# Patient Record
Sex: Female | Born: 1980 | Race: White | Hispanic: No | Marital: Single | Smoking: Current every day smoker
Health system: Southern US, Community
[De-identification: ages and names within clinical notes are randomized; demographics above are authoritative.]

## PROBLEM LIST (undated history)

## (undated) DIAGNOSIS — N390 Urinary tract infection, site not specified: Secondary | ICD-10-CM

## (undated) DIAGNOSIS — F419 Anxiety disorder, unspecified: Secondary | ICD-10-CM

## (undated) DIAGNOSIS — R251 Tremor, unspecified: Secondary | ICD-10-CM

## (undated) DIAGNOSIS — J45909 Unspecified asthma, uncomplicated: Secondary | ICD-10-CM

## (undated) HISTORY — PX: TONSILLECTOMY: SUR1361

## (undated) HISTORY — PX: WISDOM TOOTH EXTRACTION: SHX21

---

## 2010-10-12 ENCOUNTER — Emergency Department (HOSPITAL_BASED_OUTPATIENT_CLINIC_OR_DEPARTMENT_OTHER)
Admission: EM | Admit: 2010-10-12 | Discharge: 2010-10-12 | Disposition: A | Discharge status: Home / Self Care | Payer: Self-pay | Attending: Emergency Medicine | Admitting: Emergency Medicine

## 2010-10-12 ENCOUNTER — Emergency Department (INDEPENDENT_AMBULATORY_CARE_PROVIDER_SITE_OTHER): Payer: Self-pay

## 2010-10-12 DIAGNOSIS — R05 Cough: Secondary | ICD-10-CM

## 2010-10-12 DIAGNOSIS — R0989 Other specified symptoms and signs involving the circulatory and respiratory systems: Secondary | ICD-10-CM

## 2010-10-12 DIAGNOSIS — R0602 Shortness of breath: Secondary | ICD-10-CM

## 2010-10-12 DIAGNOSIS — F17200 Nicotine dependence, unspecified, uncomplicated: Secondary | ICD-10-CM | POA: Insufficient documentation

## 2010-10-12 DIAGNOSIS — J45909 Unspecified asthma, uncomplicated: Secondary | ICD-10-CM | POA: Insufficient documentation

## 2010-10-12 DIAGNOSIS — R059 Cough, unspecified: Secondary | ICD-10-CM | POA: Insufficient documentation

## 2010-10-12 LAB — D-DIMER, QUANTITATIVE: D-Dimer, Quant: <0.22 ug/mL-FEU (ref 0.00–0.48)

## 2014-06-10 ENCOUNTER — Ambulatory Visit (HOSPITAL_COMMUNITY): Payer: Self-pay | Admitting: Psychiatry

## 2016-05-16 ENCOUNTER — Emergency Department (HOSPITAL_BASED_OUTPATIENT_CLINIC_OR_DEPARTMENT_OTHER): Payer: BLUE CROSS/BLUE SHIELD

## 2016-05-16 ENCOUNTER — Encounter (HOSPITAL_BASED_OUTPATIENT_CLINIC_OR_DEPARTMENT_OTHER): Payer: Self-pay | Admitting: *Deleted

## 2016-05-16 ENCOUNTER — Emergency Department (HOSPITAL_BASED_OUTPATIENT_CLINIC_OR_DEPARTMENT_OTHER)
Admission: EM | Admit: 2016-05-16 | Discharge: 2016-05-16 | Disposition: A | Discharge status: Home / Self Care | Payer: BLUE CROSS/BLUE SHIELD | Attending: Emergency Medicine | Admitting: Emergency Medicine

## 2016-05-16 DIAGNOSIS — F17200 Nicotine dependence, unspecified, uncomplicated: Secondary | ICD-10-CM | POA: Diagnosis not present

## 2016-05-16 DIAGNOSIS — R0781 Pleurodynia: Secondary | ICD-10-CM | POA: Diagnosis present

## 2016-05-16 DIAGNOSIS — Z79899 Other long term (current) drug therapy: Secondary | ICD-10-CM | POA: Insufficient documentation

## 2016-05-16 DIAGNOSIS — J45901 Unspecified asthma with (acute) exacerbation: Secondary | ICD-10-CM | POA: Insufficient documentation

## 2016-05-16 DIAGNOSIS — Z7951 Long term (current) use of inhaled steroids: Secondary | ICD-10-CM | POA: Diagnosis not present

## 2016-05-16 HISTORY — DX: Anxiety disorder, unspecified: F41.9

## 2016-05-16 HISTORY — DX: Urinary tract infection, site not specified: N39.0

## 2016-05-16 HISTORY — DX: Tremor, unspecified: R25.1

## 2016-05-16 HISTORY — DX: Unspecified asthma, uncomplicated: J45.909

## 2016-05-16 LAB — URINE MICROSCOPIC-ADD ON

## 2016-05-16 LAB — URINALYSIS, ROUTINE W REFLEX MICROSCOPIC
Bilirubin Urine: NEGATIVE
GLUCOSE, UA: NEGATIVE mg/dL
Hgb urine dipstick: NEGATIVE
KETONES UR: NEGATIVE mg/dL
NITRITE: NEGATIVE
PH: 5 (ref 5.0–8.0)
PROTEIN: NEGATIVE mg/dL
Specific Gravity, Urine: 1.045 — ABNORMAL HIGH (ref 1.005–1.030)

## 2016-05-16 LAB — CBC WITH DIFFERENTIAL/PLATELET
Basophils Absolute: 0 10*3/uL (ref 0.0–0.1)
Basophils Relative: 0 %
Eosinophils Absolute: 0.1 10*3/uL (ref 0.0–0.7)
Eosinophils Relative: 1 %
HEMATOCRIT: 42.5 % (ref 36.0–46.0)
HEMOGLOBIN: 14.4 g/dL (ref 12.0–15.0)
LYMPHS ABS: 1.3 10*3/uL (ref 0.7–4.0)
LYMPHS PCT: 15 %
MCH: 31.7 pg (ref 26.0–34.0)
MCHC: 33.9 g/dL (ref 30.0–36.0)
MCV: 93.6 fL (ref 78.0–100.0)
MONO ABS: 0.2 10*3/uL (ref 0.1–1.0)
MONOS PCT: 3 %
NEUTROS ABS: 7 10*3/uL (ref 1.7–7.7)
NEUTROS PCT: 81 %
Platelets: 253 10*3/uL (ref 150–400)
RBC: 4.54 MIL/uL (ref 3.87–5.11)
RDW: 12.3 % (ref 11.5–15.5)
WBC: 8.6 10*3/uL (ref 4.0–10.5)

## 2016-05-16 LAB — TROPONIN I

## 2016-05-16 LAB — BASIC METABOLIC PANEL
ANION GAP: 6 (ref 5–15)
BUN: 20 mg/dL (ref 6–20)
CHLORIDE: 108 mmol/L (ref 101–111)
CO2: 24 mmol/L (ref 22–32)
Calcium: 9.9 mg/dL (ref 8.9–10.3)
Creatinine, Ser: 0.57 mg/dL (ref 0.44–1.00)
GFR calc non Af Amer: >60 mL/min (ref >60)
GLUCOSE: 93 mg/dL (ref 65–99)
POTASSIUM: 3.7 mmol/L (ref 3.5–5.1)
Sodium: 138 mmol/L (ref 135–145)

## 2016-05-16 LAB — I-STAT VENOUS BLOOD GAS, ED
Acid-base deficit: 3 mmol/L — ABNORMAL HIGH (ref 0.0–2.0)
BICARBONATE: 21.7 mmol/L (ref 20.0–28.0)
O2 Saturation: 74 %
PCO2 VEN: 37.3 mmHg — AB (ref 44.0–60.0)
TCO2: 23 mmol/L (ref 0–100)
pH, Ven: 7.373 (ref 7.250–7.430)
pO2, Ven: 40 mmHg (ref 32.0–45.0)

## 2016-05-16 LAB — PREGNANCY, URINE: Preg Test, Ur: NEGATIVE

## 2016-05-16 LAB — D-DIMER, QUANTITATIVE: D-Dimer, Quant: 0.74 ug/mL-FEU — ABNORMAL HIGH (ref 0.00–0.50)

## 2016-05-16 MED ORDER — METHYLPREDNISOLONE SODIUM SUCC 125 MG IJ SOLR
125.0000 mg | Freq: Once | INTRAMUSCULAR | Status: AC
  Administered 2016-05-16: 125 mg via INTRAVENOUS
  Filled 2016-05-16: qty 2

## 2016-05-16 MED ORDER — FENTANYL CITRATE (PF) 100 MCG/2ML IJ SOLN
100.0000 ug | Freq: Once | INTRAMUSCULAR | Status: AC
  Administered 2016-05-16: 100 ug via INTRAVENOUS
  Filled 2016-05-16: qty 2

## 2016-05-16 MED ORDER — ALBUTEROL (5 MG/ML) CONTINUOUS INHALATION SOLN
10.0000 mg/h | INHALATION_SOLUTION | RESPIRATORY_TRACT | Status: AC
  Administered 2016-05-16: 10 mg/h via RESPIRATORY_TRACT
  Filled 2016-05-16: qty 20

## 2016-05-16 MED ORDER — IPRATROPIUM BROMIDE 0.02 % IN SOLN
0.5000 mg | Freq: Once | RESPIRATORY_TRACT | Status: AC
  Administered 2016-05-16: 0.5 mg via RESPIRATORY_TRACT
  Filled 2016-05-16: qty 2.5

## 2016-05-16 MED ORDER — HYDROCODONE-ACETAMINOPHEN 5-325 MG PO TABS: 1.0000 | ORAL_TABLET | ORAL | 0 refills | Status: AC | PRN

## 2016-05-16 MED ORDER — MAGNESIUM SULFATE 2 GM/50ML IV SOLN
2.0000 g | Freq: Once | INTRAVENOUS | Status: AC
  Administered 2016-05-16: 2 g via INTRAVENOUS
  Filled 2016-05-16: qty 50

## 2016-05-16 MED ORDER — PREDNISONE 20 MG PO TABS: ORAL_TABLET | ORAL | 0 refills | Status: AC

## 2016-05-16 MED ORDER — ALBUTEROL SULFATE (2.5 MG/3ML) 0.083% IN NEBU: 2.5000 mg | INHALATION_SOLUTION | RESPIRATORY_TRACT | 0 refills | Status: AC | PRN

## 2016-05-16 MED ORDER — ALBUTEROL (5 MG/ML) CONTINUOUS INHALATION SOLN
10.0000 mg/h | INHALATION_SOLUTION | RESPIRATORY_TRACT | Status: DC
  Administered 2016-05-16: 10 mg/h via RESPIRATORY_TRACT

## 2016-05-16 MED ORDER — IOPAMIDOL (ISOVUE-370) INJECTION 76%
100.0000 mL | Freq: Once | INTRAVENOUS | Status: AC | PRN
  Administered 2016-05-16: 100 mL via INTRAVENOUS

## 2016-05-16 NOTE — ED Notes (Signed)
CAT continues 10mg /hr,  HR 75, RR 18-22, BBS decreased with possible pleural rub on right base.

## 2016-05-16 NOTE — ED Notes (Signed)
Dr. Molpus at BS 

## 2016-05-16 NOTE — ED Provider Notes (Signed)
Patient is feeling much better. Patient wants to go home. Her oxygen saturations are above 95%. Has been about one hour since her last albuterol treatment. She feels like she needs a refill on her albuterol nebulizer. She has bacteria in her urine but no urinary symptoms. Will treat with albuterol nebulizer, short course of pain medicine, prednisone, and follow-up with her PCP and/or pulmonologist in 2 days. Discussed strict return precautions.   Pricilla LovelessScott Wagner Tanzi, MD 05/16/16 [phone]

## 2016-05-16 NOTE — Progress Notes (Signed)
Placed patient on 3 liter nasal cannula to keep patient's SPO2 > = 94%.

## 2016-05-16 NOTE — ED Notes (Signed)
Back from CT, no changes, returned to CAT neb, neb in progress.

## 2016-05-16 NOTE — ED Notes (Addendum)
To CT, pt remains alert, NAD, calm, interactive, no changes, no respiratory distress, "still difficult and hurts to breathe", VSS.

## 2016-05-16 NOTE — ED Notes (Signed)
Back from CT

## 2016-05-16 NOTE — ED Notes (Addendum)
C/o CP and sob, onset yesterday around 1400, while driving in car (~2 hr ride), pinpoints to mid chest, hard to take a deep breath, not getting enough air, hurts to breathe and cough, "felt like pleurisy and heartburn", rates pain 9/10, h/o asthma, inhales and nebs at home have not helped, no relief with tums, LS CTA and diminished. Also reports smoker and uses BC pills. Takes a beta blocker. Currently being treated for UTI. LMP 2 weeks ago. (denies: fever, productive cough, dizziness, nvd, bleeding or other sx).

## 2016-05-16 NOTE — ED Notes (Signed)
EDP into room 

## 2016-05-16 NOTE — ED Provider Notes (Signed)
[facility] Provider Note: Laura Dell, MD, FACEP  CSN: [id number] MRN: [id number] ARRIVAL: 05/16/16 at 0437   CHIEF COMPLAINT  Chest Pain   HISTORY OF PRESENT ILLNESS  Laura Orozco is a 35 y.o. female with a history of asthma. She is here with chest pain and shortness of breath that began yesterday afternoon. She describes the pain as sharp, substernal and pleuritic in nature. Pain is worse with deep breathing or coughing. It is better when standing. It radiates to her back. She rates about a 9 out of 10. It is associated with shortness of breath that has not responded to her usual asthma inhaler. She has not had a fever. She has avoided coughing due to pain. She has not had a fever. She was recently treated for UTI with Macrobid. She also had a 2 and half hour car ride yesterday.  PCP: Stoney Bang, PA-C  Past Medical History:  Diagnosis Date  . Anxiety   . Asthma   . Occasional tremors   . UTI (lower urinary tract infection)     Past Surgical History:  Procedure Laterality Date  . TONSILLECTOMY    . WISDOM TOOTH EXTRACTION      History reviewed. No pertinent family history.  Social History  Substance Use Topics  . Smoking status: Current Every Day Smoker  . Smokeless tobacco: Never Used  . Alcohol use Yes    Prior to Admission medications   Medication Sig Start Date End Date Taking? Authorizing Provider  ALBUTEROL IN Inhale into the lungs.   Yes Historical Provider, MD  Escitalopram Oxalate (LEXAPRO PO) Take by mouth.   Yes Historical Provider, MD  PROPRANOLOL HCL PO Take by mouth.   Yes Historical Provider, MD  albuterol (PROVENTIL) (2.5 MG/3ML) 0.083% nebulizer solution Take 3 mLs (2.5 mg total) by nebulization every 4 (four) hours as needed for wheezing or shortness of breath. 05/16/16   Pricilla Loveless, MD  HYDROcodone-acetaminophen (NORCO) 5-325 MG tablet Take 1 tablet by mouth every 4 (four) hours as needed for severe pain. 05/16/16   Pricilla Loveless, MD  predniSONE (DELTASONE) 20 MG tablet 3 tabs po day one, then 2 po daily x 4 days 05/16/16   Pricilla Loveless, MD    Allergies Review of patient's allergies indicates no known allergies.   REVIEW OF SYSTEMS  Negative except as noted here or in the History of Present Illness.   PHYSICAL EXAMINATION  Initial Vital Signs Blood pressure 158/92, pulse 77, temperature 98.2 F (36.8 C), temperature source Oral, height 5\' 5"  (1.651 m), weight 155 lb (70.3 kg), SpO2 92 %.  Examination General: Well-developed, well-nourished female in no acute distress; appearance consistent with age of record HENT: normocephalic; atraumatic Eyes: pupils equal, round and reactive to light; extraocular muscles intact Neck: supple Heart: regular rate and rhythm Lungs: Significantly decreased air movement bilaterally without frank wheezing Chest: Nontender Abdomen: soft; nondistended; nontender; no masses or hepatosplenomegaly; bowel sounds present Extremities: No deformity; full range of motion; pulses normal Neurologic: Awake, alert and oriented; motor function intact in all extremities and symmetric; no facial droop Skin: Warm and dry Psychiatric: Normal mood and affect   RESULTS  Summary of this visit's results, reviewed by myself:   EKG Interpretation  Date/Time:  Monday May 16 2016 04:55:55 EDT Ventricular Rate:  74 PR Interval:    QRS Duration: 95 QT Interval:  352 QTC Calculation: 391 R Axis:   85 Text Interpretation:  Sinus rhythm Normal ECG No previous ECGs  available Confirmed by [facility] LPMOLPUS  MD, Jonny RuizJOHN ([id number]) on 05/16/2016 5:06:20 AM      Laboratory Studies: Results for orders placed or performed during the hospital encounter of 05/16/16 (from the past 24 hour(s))  Basic metabolic panel     Status: None   Collection Time: 05/16/16  5:02 AM  Result Value Ref Range   Sodium 138 135 - 145 mmol/L   Potassium 3.7 3.5 - 5.1 mmol/L   Chloride 108 101 - 111 mmol/L   CO2 24 22 -  32 mmol/L   Glucose, Bld 93 65 - 99 mg/dL   BUN 20 6 - 20 mg/dL   Creatinine, Ser 8.410.57 0.44 - 1.00 mg/dL   Calcium 9.9 8.9 - 32.410.3 mg/dL   GFR calc non Af Amer >60 >60 mL/min   GFR calc Af Amer >60 >60 mL/min   Anion gap 6 5 - 15  CBC with Differential     Status: None   Collection Time: 05/16/16  5:02 AM  Result Value Ref Range   WBC 8.6 4.0 - 10.5 K/uL   RBC 4.54 3.87 - 5.11 MIL/uL   Hemoglobin 14.4 12.0 - 15.0 g/dL   HCT 40.142.5 02.736.0 - 25.346.0 %   MCV 93.6 78.0 - 100.0 fL   MCH 31.7 26.0 - 34.0 pg   MCHC 33.9 30.0 - 36.0 g/dL   RDW 66.412.3 40.311.5 - 47.415.5 %   Platelets 253 150 - 400 K/uL   Neutrophils Relative % 81 %   Neutro Abs 7.0 1.7 - 7.7 K/uL   Lymphocytes Relative 15 %   Lymphs Abs 1.3 0.7 - 4.0 K/uL   Monocytes Relative 3 %   Monocytes Absolute 0.2 0.1 - 1.0 K/uL   Eosinophils Relative 1 %   Eosinophils Absolute 0.1 0.0 - 0.7 K/uL   Basophils Relative 0 %   Basophils Absolute 0.0 0.0 - 0.1 K/uL  D-dimer, quantitative (not at [facility])     Status: Abnormal   Collection Time: 05/16/16  5:02 AM  Result Value Ref Range   D-Dimer, Quant 0.74 (H) 0.00 - 0.50 ug/mL-FEU  Troponin I     Status: None   Collection Time: 05/16/16  5:02 AM  Result Value Ref Range   Troponin I <0.03 <0.03 ng/mL  Pregnancy, urine     Status: None   Collection Time: 05/16/16  6:53 AM  Result Value Ref Range   Preg Test, Ur NEGATIVE NEGATIVE  Urinalysis, Routine w reflex microscopic (not at [facility])     Status: Abnormal   Collection Time: 05/16/16  6:53 AM  Result Value Ref Range   Color, Urine YELLOW YELLOW   APPearance CLEAR CLEAR   Specific Gravity, Urine 1.045 (H) 1.005 - 1.030   pH 5.0 5.0 - 8.0   Glucose, UA NEGATIVE NEGATIVE mg/dL   Hgb urine dipstick NEGATIVE NEGATIVE   Bilirubin Urine NEGATIVE NEGATIVE   Ketones, ur NEGATIVE NEGATIVE mg/dL   Protein, ur NEGATIVE NEGATIVE mg/dL   Nitrite NEGATIVE NEGATIVE   Leukocytes, UA TRACE (A) NEGATIVE  Urine microscopic-add on     Status: Abnormal    Collection Time: 05/16/16  6:53 AM  Result Value Ref Range   Squamous Epithelial / LPF 0-5 (A) NONE SEEN   WBC, UA 0-5 0 - 5 WBC/hpf   RBC / HPF 0-5 0 - 5 RBC/hpf   Bacteria, UA MANY (A) NONE SEEN  I-Stat Venous Blood Gas, ED (order at [facility] and [facility] only)     Status: Abnormal  Collection Time: 05/16/16  7:21 AM  Result Value Ref Range   pH, Ven 7.373 7.250 - 7.430   pCO2, Ven 37.3 (L) 44.0 - 60.0 mmHg   pO2, Ven 40.0 32.0 - 45.0 mmHg   Bicarbonate 21.7 20.0 - 28.0 mmol/L   TCO2 23 0 - 100 mmol/L   O2 Saturation 74.0 %   Acid-base deficit 3.0 (H) 0.0 - 2.0 mmol/L   Patient temperature 98.6 F    Collection site IV START    Drawn by RT    Sample type VENOUS    Imaging Studies: Dg Chest 2 View  Result Date: 05/16/2016 CLINICAL DATA:  Chest pain and dyspnea, onset yesterday at 14:00. EXAM: CHEST  2 VIEW COMPARISON:  10/12/2010 FINDINGS: The lungs are clear. The pulmonary vasculature is normal. Heart size is normal. Hilar and mediastinal contours are unremarkable. There is no pleural effusion. IMPRESSION: No active cardiopulmonary disease. Electronically Signed   By: Ellery Plunk M.D.   On: 05/16/2016 05:36   Ct Angio Chest Pe W Or Wo Contrast  Result Date: 05/16/2016 CLINICAL DATA:  Chest pain and dyspnea, onset around 14:00 while on a car ride. EXAM: CT ANGIOGRAPHY CHEST WITH CONTRAST TECHNIQUE: Multidetector CT imaging of the chest was performed using the standard protocol during bolus administration of intravenous contrast. Multiplanar CT image reconstructions and MIPs were obtained to evaluate the vascular anatomy. CONTRAST:  75 mL Isovue 370 intravenous COMPARISON:  Radiographs 05/16/2016 FINDINGS: Cardiovascular: There is good opacification of the pulmonary arteries. There is no pulmonary embolism. The thoracic aorta is normal in caliber and intact. Lungs: Small patchy peripheral lung opacities, indeterminate with regard to inflammatory or infectious etiologies. This is not likely be  neoplastic. No confluent airspace consolidation. Central airways: Patent and normal in caliber. Effusions: None Lymphadenopathy: None Esophagus: Under Upper abdomen: No significant abnormality Musculoskeletal: No significant skeletal lesion Review of the MIP images confirms the above findings. IMPRESSION: Negative for acute pulmonary embolism. There are small patchy peripheral lung opacities bilaterally which are indeterminate but could be infectious or inflammatory. There also is mild interlobular septal thickening indicative of interstitial fluid or thickening. Electronically Signed   By: Ellery Plunk M.D.   On: 05/16/2016 06:27    ED COURSE  Nursing notes and initial vitals signs, including pulse oximetry, reviewed.  6:35 AM Patient placed on continuous albuterol neb treatment after initial evaluation. Solu-Medrol 125 milligrams and magnesium sulfate 2 grams given IV. Air movement is slightly improved at this point. CT negative for PE.  6:50 AM Patient is feeling some improvement in her breathing after a one-hour continuous albuterol neb treatment but is still complaining of pleuritic chest pain. She has not improved enough to discharge home and we agree it is too early to commit to hospital admission. We will administer a second one-hour continuous albuterol treatment and reassess.   7:11 AM Dr. Criss Alvine to follow up on patient and make disposition.  PROCEDURES   CRITICAL CARE Performed by: Paula Libra L Total critical care time: 30 minutes Critical care time was exclusive of separately billable procedures and treating other patients. Critical care was necessary to treat or prevent imminent or life-threatening deterioration. Critical care was time spent personally by me on the following activities: development of treatment plan with patient and/or surrogate as well as nursing, discussions with consultants, evaluation of patient's response to treatment, examination of patient, obtaining  history from patient or surrogate, ordering and performing treatments and interventions, ordering and review of laboratory studies, ordering and  review of radiographic studies, pulse oximetry and re-evaluation of patient's condition.   ED DIAGNOSES     ICD-9-CM ICD-10-CM   1. Asthma exacerbation 493.92 J45.901   2. Pleuritic chest pain 786.52 R07.81        Paula Libra, MD 05/16/16 2235

## 2017-05-22 IMAGING — CT CT ANGIO CHEST
2 of 8 series · 19 of 36 positions shown · IV contrast (isovue)
Comparison: Radiographs 05/16/2016

CLINICAL DATA: Chest pain and dyspnea, onset around [DATE] while on
a car ride.

EXAM:
CT ANGIOGRAPHY CHEST WITH CONTRAST
TECHNIQUE: Multidetector CT imaging of the chest was performed using the
standard protocol during bolus administration of intravenous
contrast. Multiplanar CT image reconstructions and MIPs were
obtained to evaluate the vascular anatomy.
CONTRAST:  75 mL Isovue 370 intravenous

[Series 6: pe coronal mpr · coronal · 0.52mm/px · 1 of 151 slices shown]
[im 76/151  mediastinal]
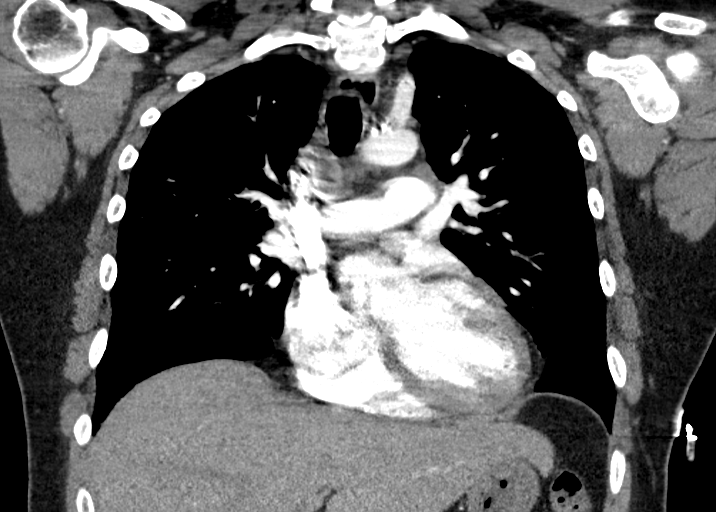

[Series 10: pe thins · axial · 0.82mm/px · z∈[-278,-20]mm · 18 of 290 slices shown]
[im 16/290  lung]
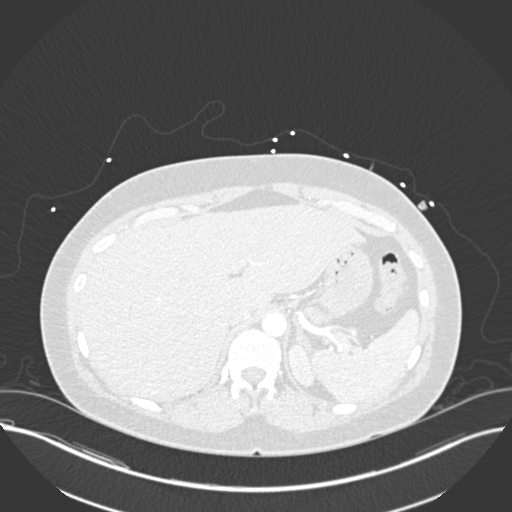
[im 31/290  mediastinal]
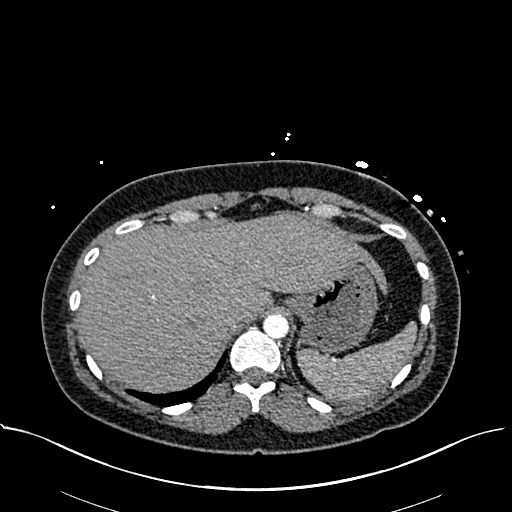
[im 46/290  lung]
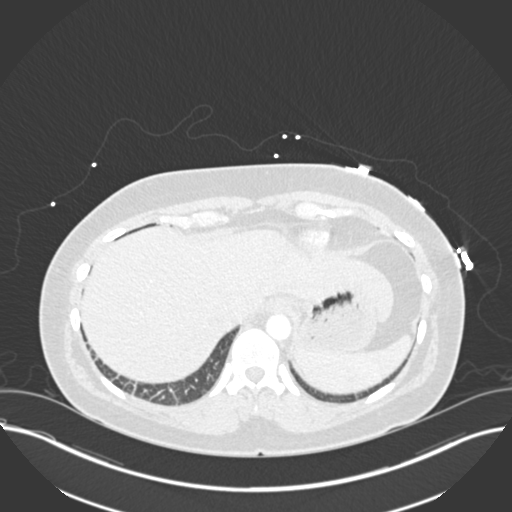
[im 61/290  mediastinal]
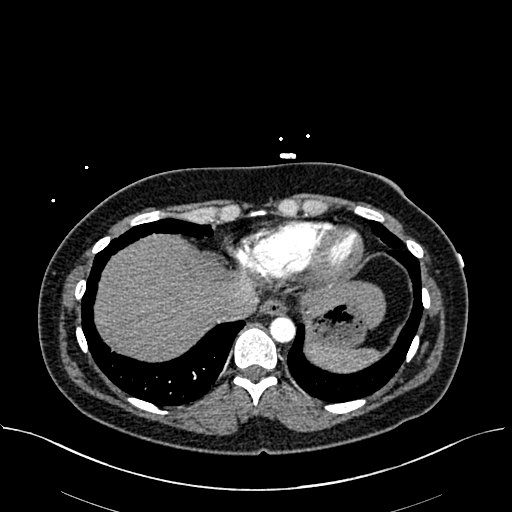
[im 77/290  lung]
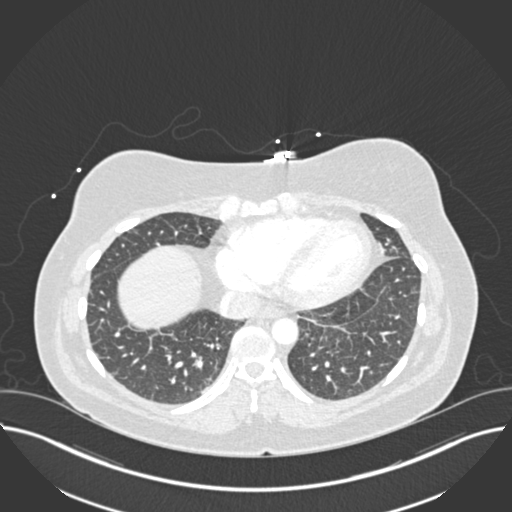
[im 92/290  mediastinal]
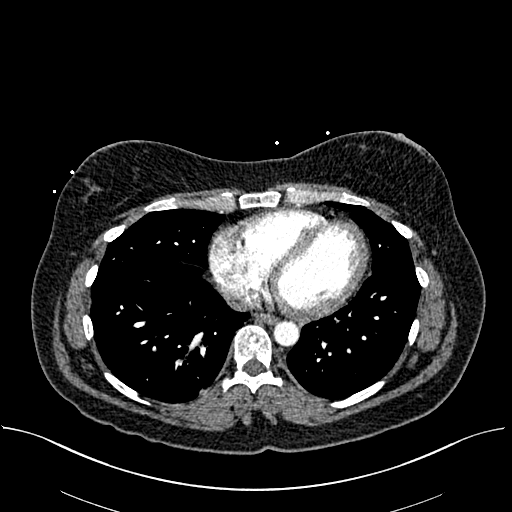
[im 107/290  lung]
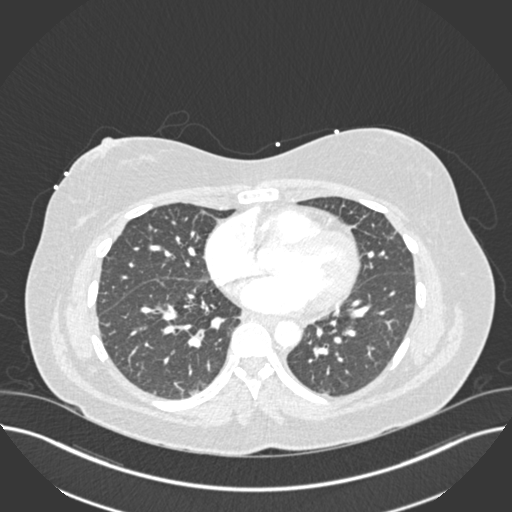
[im 122/290  mediastinal]
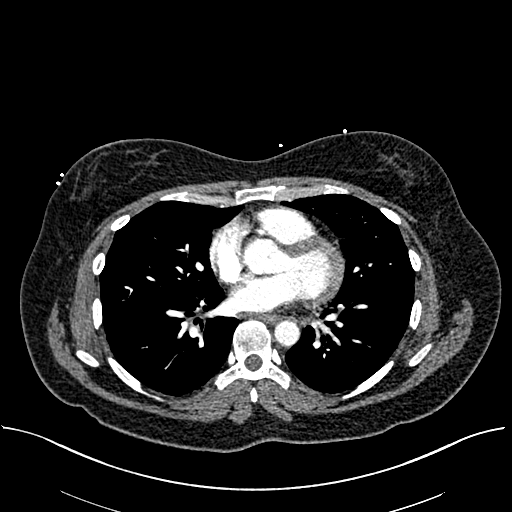
[im 137/290  lung]
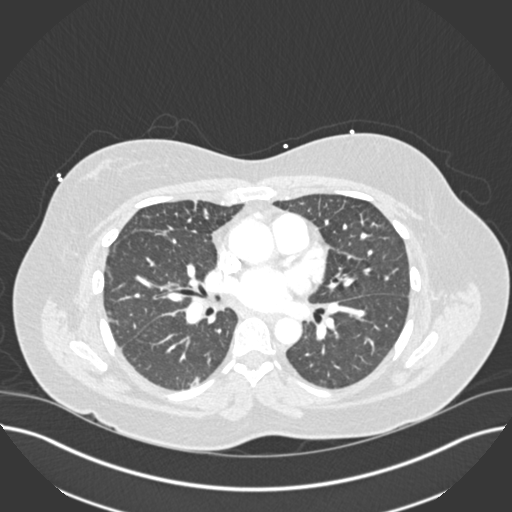
[im 153/290  mediastinal]
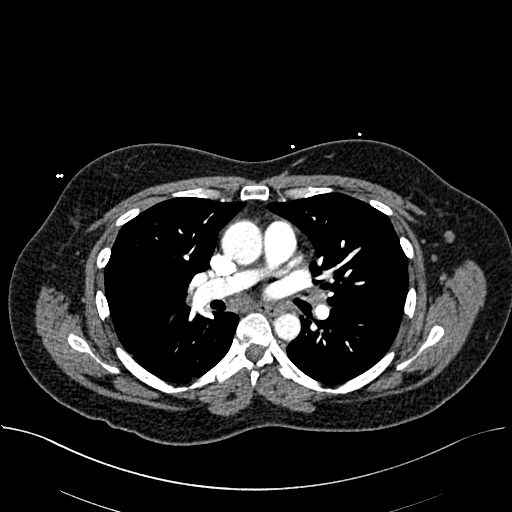
[im 168/290  lung]
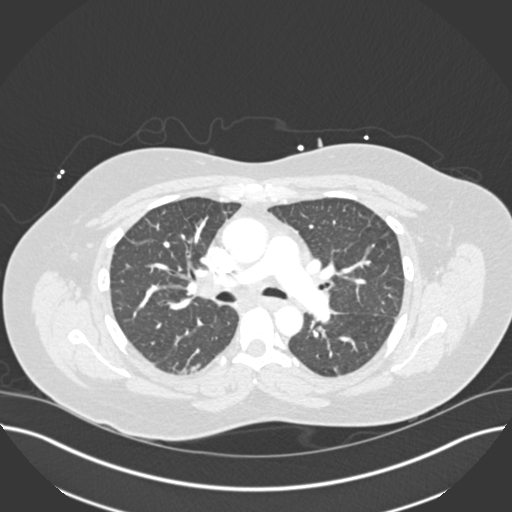
[im 183/290  mediastinal]
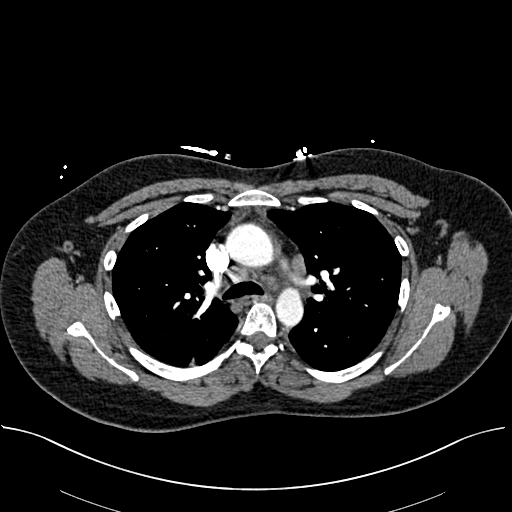
[im 198/290  lung]
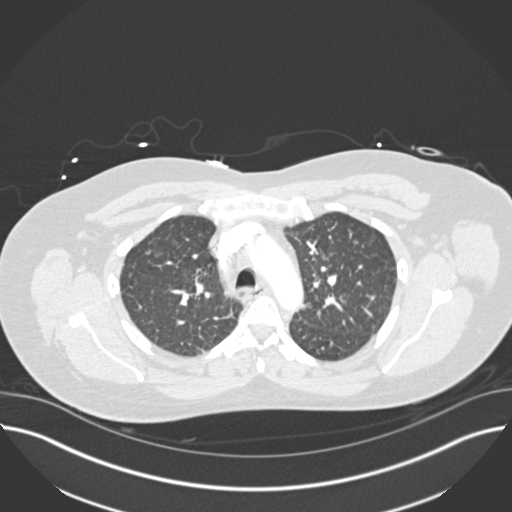
[im 213/290  mediastinal]
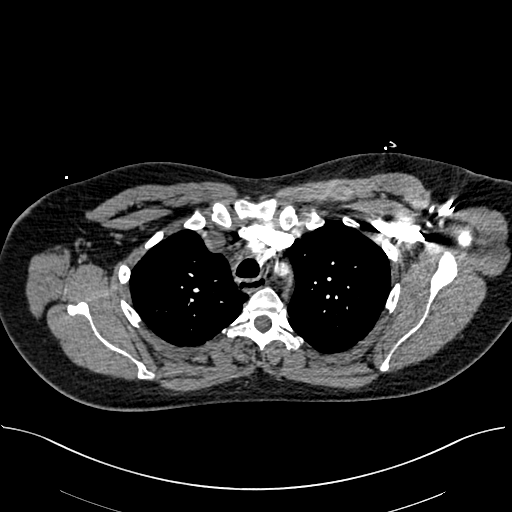
[im 229/290  lung]
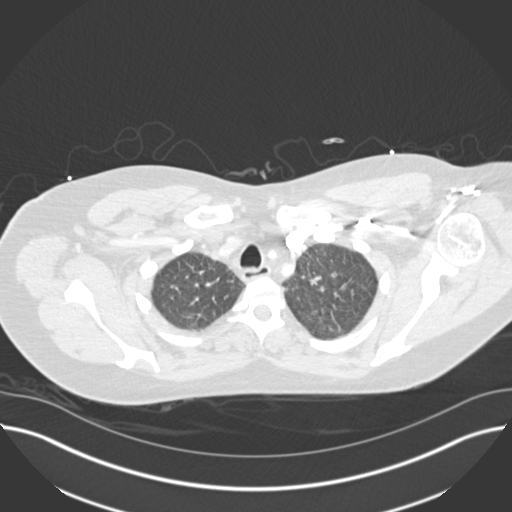
[im 244/290  mediastinal]
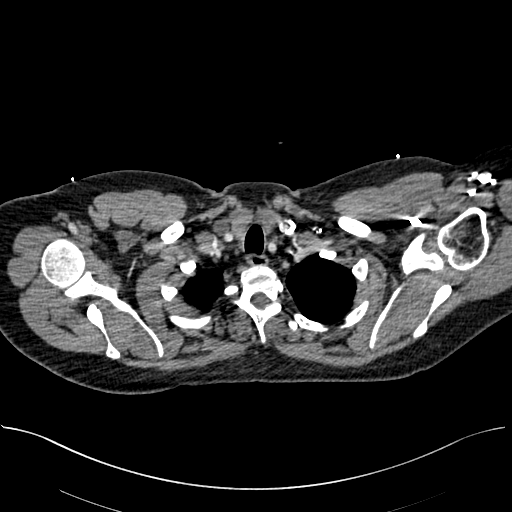
[im 259/290  lung]
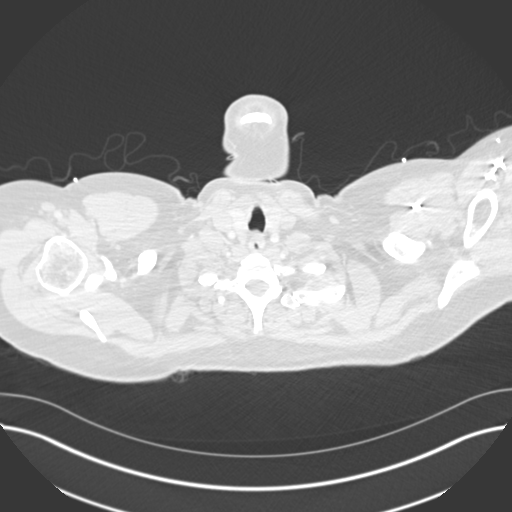
[im 274/290  mediastinal]
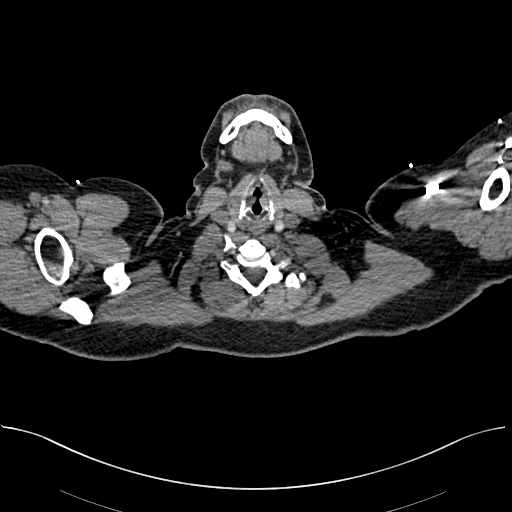

[19 of 36 positions shown; findings below may reference images not displayed]

FINDINGS: Cardiovascular: There is good opacification of the pulmonary
arteries. There is no pulmonary embolism. The thoracic aorta is
normal in caliber and intact.

Lungs: Small patchy peripheral lung opacities, indeterminate with
regard to inflammatory or infectious etiologies. This is not likely
be neoplastic. No confluent airspace consolidation.

Central airways: Patent and normal in caliber.

Effusions: None

Lymphadenopathy: None

Esophagus: Under

Upper abdomen: No significant abnormality

Musculoskeletal: No significant skeletal lesion

Review of the MIP images confirms the above findings.
IMPRESSION: Negative for acute pulmonary embolism. There are small patchy
peripheral lung opacities bilaterally which are indeterminate but
could be infectious or inflammatory. There also is mild interlobular
septal thickening indicative of interstitial fluid or thickening.

## 2024-07-11 NOTE — Progress Notes (Signed)
 Laura Orozco presents for  Chief Complaint  Patient presents with  . Dizziness    SUBJECTIVE  Laura Orozco presents today c/o intermittent dizziness, loss of balance X 5-6 weeks.  She reported that sx come and go.  Patient stated the sx usually occur in the mornings.  She has mild blurred vision, headaches, and palpitations  She denies NT, NVD, Chest pain,  Recently started Phentermine for weight loss. She notes that symptoms started after starting this medication.     Allergies[1]  Current Medications[2]  The following portions of the patient's history were reviewed and updated as appropriate: allergies, current medications, past medical history, past social history, past family history, and problem list.   ROS   See HPI.  OBJECTIVE  BP (!) 155/106   Pulse 75   Temp 98.1 F (36.7 C) (Oral)   Resp 16   Ht 1.651 m (5' 5)   Wt 83.9 kg (185 lb)   SpO2 98%   BMI 30.79 kg/m   GENERAL APPEARANCE: Well appearing, well developed, no acute distress. LUNGS: Clear to auscultation bilaterally HEART: Regular rate and rhythm without murmur. ABDOMEN: Normal bowel sounds, soft, non-tender, without hepato-splenomegaly EXTREMITIES: No edema NEUROLOGIC: Alert and oriented x3, no obvious deficits.    ASSESSMENT/PLAN  1. Elevated blood pressure reading (Primary) Pt BP was elevated last year during CPE. Has not returned since. Unsure of increased BP otherwise. Pt under the impression that her ears were causing her dizziness. However, BP is elevated, HA, new palpitations, and dizziness. Pt advised to stop phentermine and return for her CPE. Will reassess BP and symptoms at upcoming appt.   2. Dizziness No dizziness at this time    Return for Next scheduled follow up.  This document serves as a record of services personally performed by Sheppard Ee, FNP.  It was created on their behalf by Ileana Riggs, CMA, a trained medical scribe, and Certified Medical Assistant (CMA). During  the course of documenting the history, physical exam and medical decision making, I was functioning as a stage manager. The creation of this record is the provider's dictation and/or activities during the visit.  Electronically signed by Ileana Riggs, CMA 07/11/2024 11:21 PM     I agree the documentation is accurate and complete.  Electronically signed by: Vergil Marko Ee, FNP 07/11/2024 11:21 PM        [1] No Known Allergies [2]  Current Outpatient Medications:  .  albuterol  HFA (PROVENTIL  HFA;VENTOLIN  HFA;PROAIR  HFA) 90 mcg/actuation inhaler, Inhale 2 puffs every 4 (four) hours., Disp: 20.1 g, Rfl: 0 .  cholecalciferol (VITAMIN D3) 10 mcg (400 unit) tablet, Take  by mouth., Disp: , Rfl:  .  cyclobenzaprine (FLEXERIL) 10 mg tablet, Take 0.5-1 tablets (5-10 mg total) by mouth 3 (three) times a day as needed for muscle spasms., Disp: 60 tablet, Rfl: 0 .  escitalopram (LEXAPRO) 20 mg tablet, TAKE 1 TABLET BY MOUTH EVERY DAY, Disp: 90 tablet, Rfl: 3 .  letrozole (FEMARA) 2.5 mg tablet, Take 2 tablets (5 mg total) by mouth daily for 5 days., Disp: 10 tablet, Rfl: 3 .  phentermine 15 mg cap, 1 capsule daily. for weight management, Disp: , Rfl:  .  propranoloL (INDERAL) 20 mg tablet, Take 1 tablet (20 mg total) by mouth daily., Disp: 90 tablet, Rfl: 3 .  ALPRAZolam (XANAX) 0.5 mg tablet, Take 1/2 tablet to 1 tablet twice daily as needed for anxiety (Patient not taking: Reported on 07/11/2024), Disp: 60 tablet, Rfl: 0 .  diclofenac (VOLTAREN) 75 mg EC tablet, TAKE 1 TABLET (75 MG TOTAL) BY MOUTH TWICE A DAY WITH FOOD (Patient not taking: Reported on 07/11/2024), Disp: 60 tablet, Rfl: 0
